# Patient Record
Sex: Female | Born: 1950 | Race: White | Hispanic: No | Smoking: Never smoker
Health system: Southern US, Community
[De-identification: ages and names within clinical notes are randomized; demographics above are authoritative.]

## PROBLEM LIST (undated history)

## (undated) DIAGNOSIS — M1990 Unspecified osteoarthritis, unspecified site: Secondary | ICD-10-CM

## (undated) DIAGNOSIS — T7840XA Allergy, unspecified, initial encounter: Secondary | ICD-10-CM

## (undated) DIAGNOSIS — B829 Intestinal parasitism, unspecified: Secondary | ICD-10-CM

## (undated) DIAGNOSIS — E079 Disorder of thyroid, unspecified: Secondary | ICD-10-CM

## (undated) DIAGNOSIS — A6920 Lyme disease, unspecified: Secondary | ICD-10-CM

## (undated) DIAGNOSIS — K5790 Diverticulosis of intestine, part unspecified, without perforation or abscess without bleeding: Secondary | ICD-10-CM

## (undated) DIAGNOSIS — B2790 Infectious mononucleosis, unspecified without complication: Secondary | ICD-10-CM

## (undated) DIAGNOSIS — R569 Unspecified convulsions: Secondary | ICD-10-CM

## (undated) DIAGNOSIS — G35 Multiple sclerosis: Secondary | ICD-10-CM

## (undated) DIAGNOSIS — M810 Age-related osteoporosis without current pathological fracture: Secondary | ICD-10-CM

## (undated) HISTORY — DX: Unspecified convulsions: R56.9

## (undated) HISTORY — DX: Infectious mononucleosis, unspecified without complication: B27.90

## (undated) HISTORY — DX: Unspecified osteoarthritis, unspecified site: M19.90

## (undated) HISTORY — DX: Allergy, unspecified, initial encounter: T78.40XA

## (undated) HISTORY — DX: Age-related osteoporosis without current pathological fracture: M81.0

## (undated) HISTORY — DX: Multiple sclerosis: G35

## (undated) HISTORY — DX: Diverticulosis of intestine, part unspecified, without perforation or abscess without bleeding: K57.90

## (undated) HISTORY — DX: Intestinal parasitism, unspecified: B82.9

## (undated) HISTORY — DX: Disorder of thyroid, unspecified: E07.9

## (undated) HISTORY — DX: Lyme disease, unspecified: A69.20

## (undated) HISTORY — PX: COLONOSCOPY: SHX174

---

## 2012-12-03 ENCOUNTER — Encounter: Payer: Self-pay | Admitting: Internal Medicine

## 2012-12-20 ENCOUNTER — Encounter: Payer: Self-pay | Admitting: Internal Medicine

## 2012-12-20 ENCOUNTER — Ambulatory Visit (AMBULATORY_SURGERY_CENTER): Payer: BC Managed Care – PPO | Admitting: *Deleted

## 2012-12-20 VITALS — Ht 67.0 in | Wt 144.6 lb

## 2012-12-20 DIAGNOSIS — K921 Melena: Secondary | ICD-10-CM

## 2012-12-20 MED ORDER — MOVIPREP 100 G PO SOLR: 1.0000 | Freq: Once | ORAL | Status: DC

## 2012-12-20 NOTE — Progress Notes (Signed)
Patient states prior GI history with Jade Howard. Release of information form completed to request medical records. Patient has current complaints of heme + stools, so will proceed with colonoscopy as scheduled per recommendation of Dr. Carlyon Prows. Records release given to [facility] for request.

## 2012-12-21 ENCOUNTER — Telehealth: Payer: Self-pay | Admitting: Internal Medicine

## 2012-12-21 NOTE — Telephone Encounter (Signed)
Forward  14 pages from [facility] to Dr. Erick Blinks for review on 12-21-12 ym

## 2013-01-10 ENCOUNTER — Encounter: Payer: Self-pay | Admitting: Internal Medicine

## 2013-01-14 ENCOUNTER — Encounter: Payer: BC Managed Care – PPO | Admitting: Internal Medicine

## 2013-02-24 ENCOUNTER — Telehealth: Payer: Self-pay | Admitting: Internal Medicine

## 2013-02-24 NOTE — Telephone Encounter (Signed)
Pt reports she had a COLON 10 yrs ago in [place] and she doesn't remember where. She has had one episode of rectal bleeding and mentioned it to Dr Alessandra Bevels, her PCP and she ordered the COLON. She found out today her procedure will not be covered as well and she can't afford to have it done. Pt states she's have no problem since the one episode. Suggested to pt she find where her procedure was done and get the report and we will discuss rescheduling her procedure at the 10 year mark. i will check with Dr Rhea Belton. Pt stated understanding.

## 2013-02-24 NOTE — Telephone Encounter (Signed)
No charge.  To Graciella Freer

## 2013-02-25 ENCOUNTER — Encounter: Payer: BC Managed Care – PPO | Admitting: Internal Medicine

## 2013-04-06 ENCOUNTER — Telehealth: Payer: Self-pay | Admitting: Internal Medicine

## 2013-04-06 NOTE — Telephone Encounter (Signed)
Spoke with patient and told her I do not have any notes as to who called her. She will check with her PCP about this.

## 2013-04-18 ENCOUNTER — Other Ambulatory Visit: Payer: Self-pay | Admitting: Internal Medicine

## 2013-04-18 DIAGNOSIS — R109 Unspecified abdominal pain: Secondary | ICD-10-CM

## 2013-04-20 ENCOUNTER — Ambulatory Visit
Admission: RE | Admit: 2013-04-20 | Discharge: 2013-04-20 | Disposition: A | Discharge status: Home / Self Care | Payer: BC Managed Care – PPO | Source: Ambulatory Visit | Attending: Internal Medicine | Admitting: Internal Medicine

## 2013-04-20 DIAGNOSIS — R109 Unspecified abdominal pain: Secondary | ICD-10-CM

## 2013-06-21 ENCOUNTER — Ambulatory Visit (AMBULATORY_SURGERY_CENTER): Payer: BC Managed Care – PPO

## 2013-06-21 ENCOUNTER — Encounter: Payer: Self-pay | Admitting: Internal Medicine

## 2013-06-21 VITALS — Ht 67.0 in | Wt 150.4 lb

## 2013-06-21 DIAGNOSIS — Z1211 Encounter for screening for malignant neoplasm of colon: Secondary | ICD-10-CM

## 2013-06-21 MED ORDER — MOVIPREP 100 G PO SOLR: ORAL | Status: DC

## 2013-06-21 NOTE — Progress Notes (Signed)
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 Will discuss with the manager if the colonoscopy will need to be rescheduled. Pt aware.

## 2013-06-22 ENCOUNTER — Telehealth: Payer: Self-pay

## 2013-06-22 NOTE — Telephone Encounter (Signed)
Intestinal parasite such as Giardia or roundworm should not prevent or complicate screening colonoscopy I'm okay proceeding with screening colonoscopy unless she prefers to reschedule

## 2013-06-22 NOTE — Telephone Encounter (Signed)
Spoke with pt and informed her it was OK per Dr Rhea Belton, to proceed with the colonoscopy on 07/08/13 as scheduled.She did state she found her copy of the last colonoscopy done 10 years ago and she will bring it with her to her appt. She will call our office if she has any questions or problems.Jade Howard

## 2013-06-22 NOTE — Telephone Encounter (Signed)
Dr. 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 She went to [place] 5 years ago, but her doctor does not know how or when she contacted the parasites.Do you need to see her in the office or should we cancel her appointment on 07/08/13, until she finishes her meds. Please advise. Thanks,Aeriel Boulay

## 2013-07-05 ENCOUNTER — Telehealth: Payer: Self-pay | Admitting: Internal Medicine

## 2013-07-05 NOTE — Telephone Encounter (Signed)
noted 

## 2013-07-05 NOTE — Telephone Encounter (Signed)
Can attempt nonsedated colonoscopy, I agree that she should have a driver in case she does need mild sedation She has no driver no sedation will be/can be given

## 2013-07-05 NOTE — Telephone Encounter (Signed)
Dr. Rhea Belton:  Pt is scheduled for colonoscopy this Friday 07/08/13.  After watching Emmi video,she does not want sedation for procedure.  She is aware that she may feel some cramping and discomfort during procedure.  I told her that I would make you aware of her request and that she would need to discuss with you and nurse anesthetist day of procedure.  I also told her that she would still need someone with her day of procedure just in case she decides to have sedation.  Are you okay with this?  Thanks, Olegario Messier in [place].

## 2013-07-08 ENCOUNTER — Ambulatory Visit (AMBULATORY_SURGERY_CENTER): Payer: BC Managed Care – PPO | Admitting: Internal Medicine

## 2013-07-08 ENCOUNTER — Encounter: Payer: Self-pay | Admitting: Internal Medicine

## 2013-07-08 VITALS — BP 152/91 | HR 51 | Resp 20 | Ht 67.0 in | Wt 135.0 lb

## 2013-07-08 DIAGNOSIS — Z1211 Encounter for screening for malignant neoplasm of colon: Secondary | ICD-10-CM

## 2013-07-08 MED ORDER — SODIUM CHLORIDE 0.9 % IV SOLN: 500.0000 mL | INTRAVENOUS | Status: DC

## 2013-07-08 NOTE — Patient Instructions (Addendum)
YOU HAD AN ENDOSCOPIC PROCEDURE TODAY AT THE [facility]: Refer to the procedure report that was given to you for any specific questions about what was found during the examination.  If the procedure report does not answer your questions, please call your gastroenterologist to clarify.  If you requested that your care partner not be given the details of your procedure findings, then the procedure report has been included in a sealed envelope for you to review at your convenience later.  YOU SHOULD EXPECT: Some feelings of bloating in the abdomen. Passage of more gas than usual.  Walking can help get rid of the air that was put into your GI tract during the procedure and reduce the bloating. If you had a lower endoscopy (such as a colonoscopy or flexible sigmoidoscopy) you may notice spotting of blood in your stool or on the toilet paper. If you underwent a bowel prep for your procedure, then you may not have a normal bowel movement for a few days.  DIET: Your first meal following the procedure should be a light meal and then it is ok to progress to your normal diet.  A half-sandwich or bowl of soup is an example of a good first meal.  Heavy or fried foods are harder to digest and may make you feel nauseous or bloated.  Likewise meals heavy in dairy and vegetables can cause extra gas to form and this can also increase the bloating.  Drink plenty of fluids but you should avoid alcoholic beverages for 24 hours.  ACTIVITY: Your care partner should take you home directly after the procedure.  You should plan to take it easy, moving slowly for the rest of the day.  You can resume normal activity the day after the procedure however you should NOT DRIVE or use heavy machinery for 24 hours (because of the sedation medicines used during the test).    SYMPTOMS TO REPORT IMMEDIATELY: A gastroenterologist can be reached at any hour.  During normal business hours, 8:30 AM to 5:00 PM Monday through Friday,  call [phone].  After hours and on weekends, please call the GI answering service at [phone] who will take a message and have the physician on call contact you.   Following lower endoscopy (colonoscopy or flexible sigmoidoscopy):  Excessive amounts of blood in the stool  Significant tenderness or worsening of abdominal pains  Swelling of the abdomen that is new, acute  Fever of 100F or higher  FOLLOW UP: If any biopsies were taken you will be contacted by phone or by letter within the next 1-3 weeks.  Call your gastroenterologist if you have not heard about the biopsies in 3 weeks.  Our staff will call the home number listed on your records the next business day following your procedure to check on you and address any questions or concerns that you may have at that time regarding the information given to you following your procedure. This is a courtesy call and so if there is no answer at the home number and we have not heard from you through the emergency physician on call, we will assume that you have returned to your regular daily activities without incident.  SIGNATURES/CONFIDENTIALITY: You and/or your care partner have signed paperwork which will be entered into your electronic medical record.  These signatures attest to the fact that that the information above on your After Visit Summary has been reviewed and is understood.  Full responsibility of the confidentiality of this   discharge information lies with you and/or your care-partner.  Resume medications. Information given on diverticulosis and high fiber diet with discharge instructions. 

## 2013-07-08 NOTE — Progress Notes (Signed)
Pt. Received into recovery without sedation,stable and expelling large amounts of air, abdomen soft denies pain. Discharge instructions completed with pt. And she verbalize understanding.

## 2013-07-08 NOTE — Progress Notes (Signed)
Patient did not experience any of the following events: a burn prior to discharge; a fall within the facility; wrong site/side/patient/procedure/implant event; or a hospital transfer or hospital admission upon discharge from the facility. (G8907) Patient did not have preoperative order for IV antibiotic SSI prophylaxis. (G8918)  

## 2013-07-08 NOTE — Op Note (Signed)
[facility] [address] [place], [address]   COLONOSCOPY PROCEDURE REPORT  PATIENT: Jade Howard, Jade Howard  MR#: [id number] BIRTHDATE: 09-Aug-1951 , 61  yrs. old GENDER: Female ENDOSCOPIST: Beverley Fiedler, MD REFERRED [id number] Ananias Pilgrim, M.D. PROCEDURE DATE:  07/08/2013 PROCEDURE:   Colonoscopy, screening First Screening Colonoscopy - Avg.  risk and is 50 yrs.  old or older - No.  Prior Negative Screening - Now for repeat screening. 10 or more years since last screening  History of Adenoma - Now for follow-up colonoscopy & has been > or = to 3 yrs.  N/A  Polyps Removed Today? No.  Recommend repeat exam, <10 yrs? No. ASA CLASS:   Class II INDICATIONS:average risk screening and Last colonoscopy performed 10 years ago. MEDICATIONS: None  DESCRIPTION OF PROCEDURE:   After the risks benefits and alternatives of the procedure were thoroughly explained, informed consent was obtained.  A digital rectal exam revealed no rectal mass.   The LB [id number]  endoscope was introduced through the anus and advanced to the terminal ileum which was intubated for a short distance. No adverse events experienced.   The quality of the prep was good, using MoviPrep  The instrument was then slowly withdrawn as the colon was fully examined.  COLON FINDINGS: The mucosa appeared normal in the terminal ileum. There was mild scattered diverticulosis noted at the hepatic flexure, in the descending colon, and sigmoid colon.   The colon mucosa was otherwise normal.  Retroflexed views revealed external hemorrhoids. The time to cecum=7 minutes 26 seconds.  Withdrawal time=14 minutes 10 seconds.  The scope was withdrawn and the procedure completed. COMPLICATIONS: There were no complications.  ENDOSCOPIC IMPRESSION: 1.   Normal mucosa in the terminal ileum 2.   There was mild diverticulosis noted at the hepatic flexure, in the descending colon, and sigmoid colon 3.   The colon mucosa was  otherwise normal  RECOMMENDATIONS: 1.  High fiber diet 2.  You should continue to follow colorectal cancer screening guidelines for "routine risk" patients with a repeat colonoscopy in 10 years.  There is no need for FOBT (stool) testing for at least 5 years.   eSigned:  Beverley Fiedler, MD 07/08/2013 2:14 PM   cc: The Patient and Mia Creek, MD

## 2013-07-08 NOTE — Progress Notes (Signed)
Pt requested NO IV sedation- Dr Rhea Belton discussed with patient, agreed. Post procedure- Pt comfortable tolerated well. Alert  To RR

## 2013-07-11 ENCOUNTER — Telehealth: Payer: Self-pay | Admitting: *Deleted

## 2013-07-11 NOTE — Telephone Encounter (Signed)
Left message that we called for f/u 

## 2014-05-13 IMAGING — US US PELVIS COMPLETE
1 series · 14 of 25 positions shown · non-contrast
Comparison: None

CLINICAL DATA: Pelvic pain

TRANSABDOMINAL AND TRANSVAGINAL ULTRASOUND OF PELVIS
TECHNIQUE: Both transabdominal and transvaginal ultrasound
examinations of the pelvis were performed including evaluation of
the uterus, ovaries, adnexal regions, and pelvic cul-de-sac.

[Series 1: us pelvis complete · 0.32mm/px · 14 of 35 slices shown]
[im 1/35]
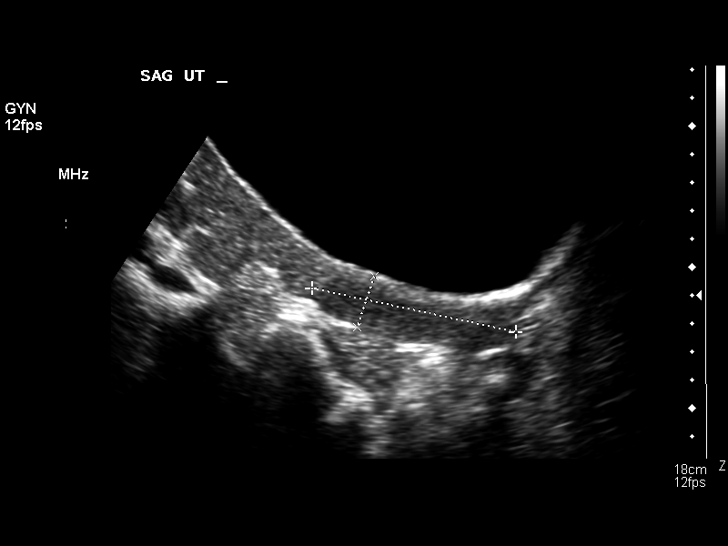
[im 3/35]
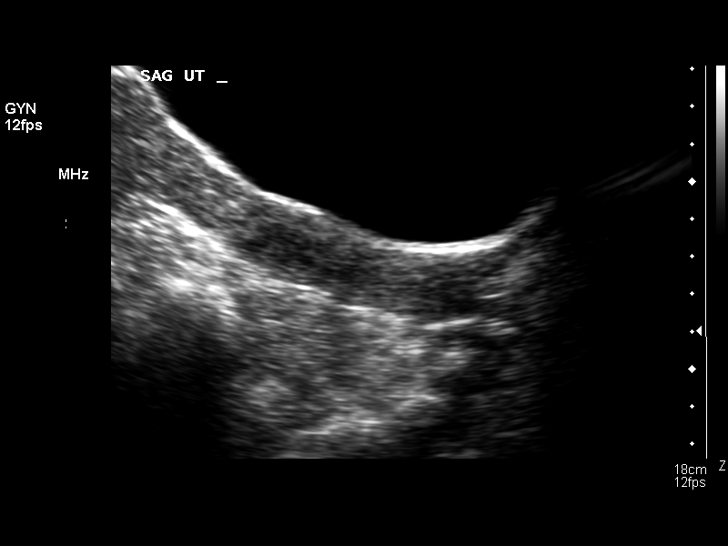
[im 6/35]
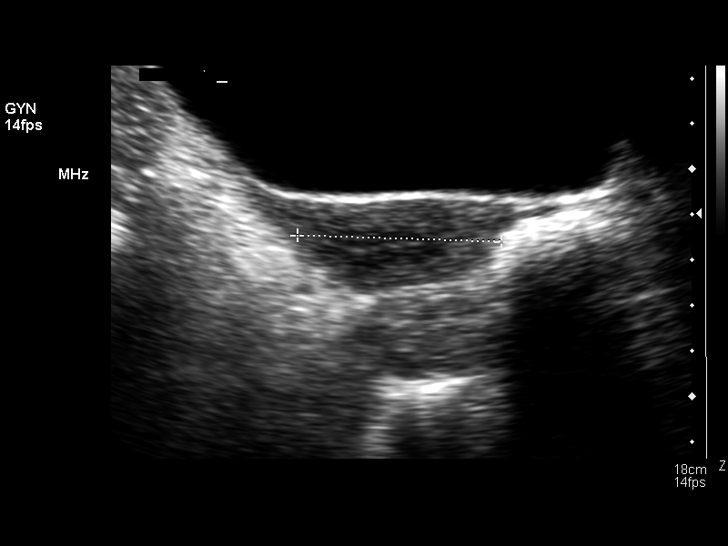
[im 9/35]
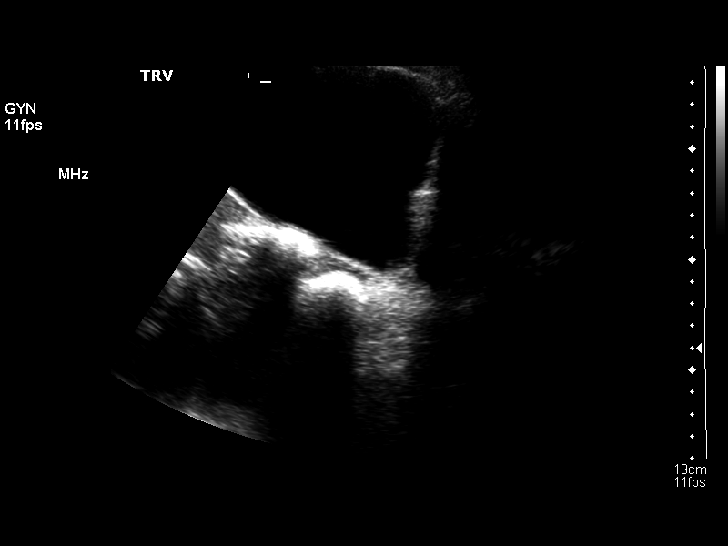
[im 12/35]
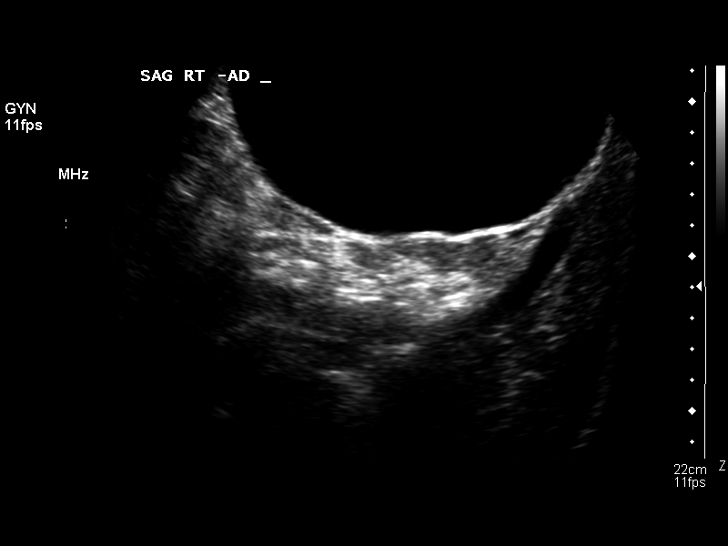
[im 13/35]
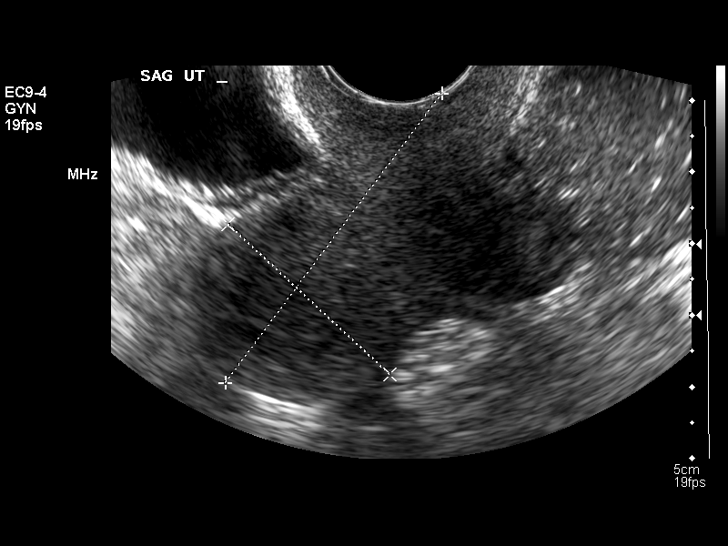
[im 16/35]
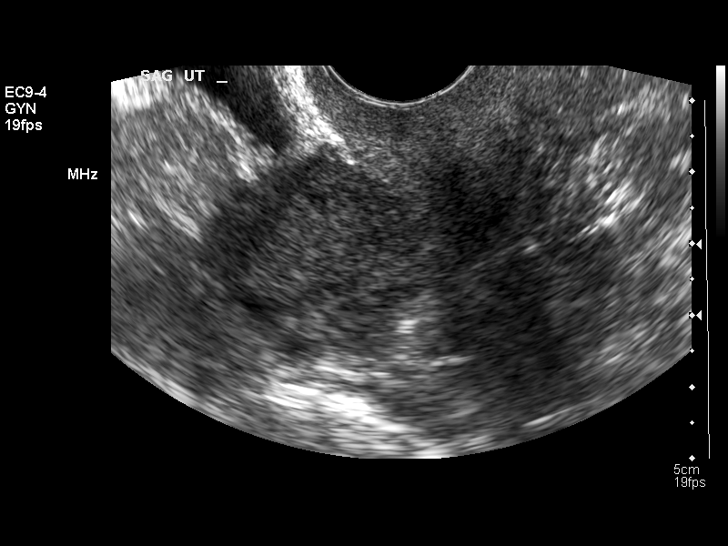
[im 19/35]
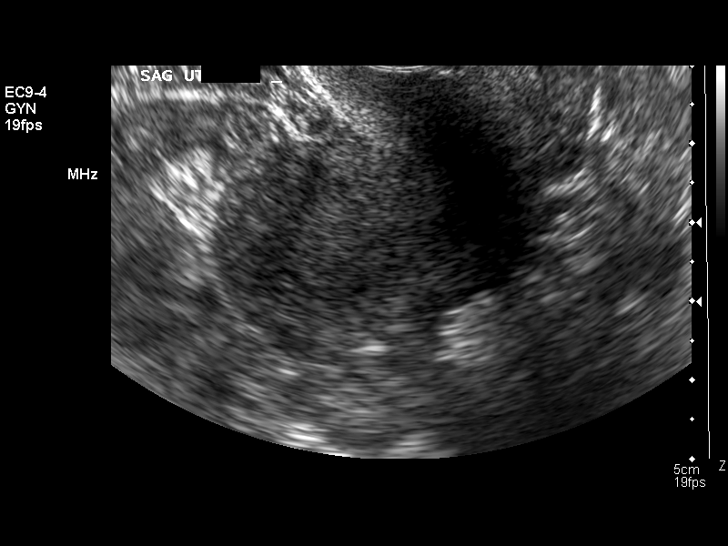
[im 22/35]
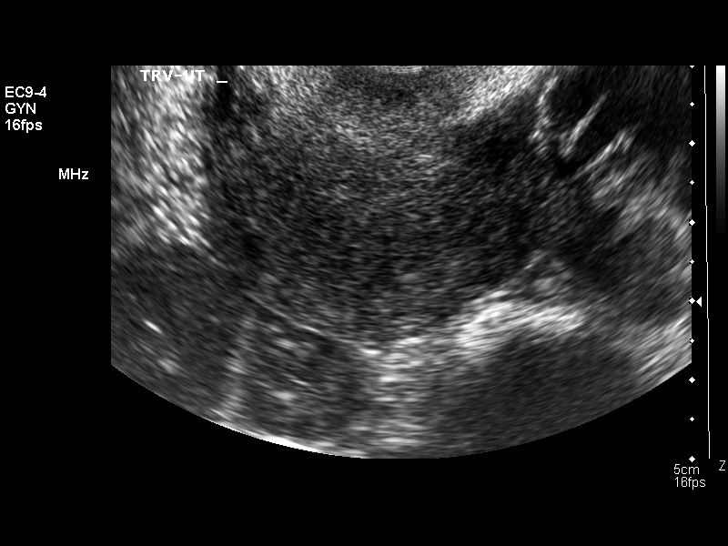
[im 23/35]
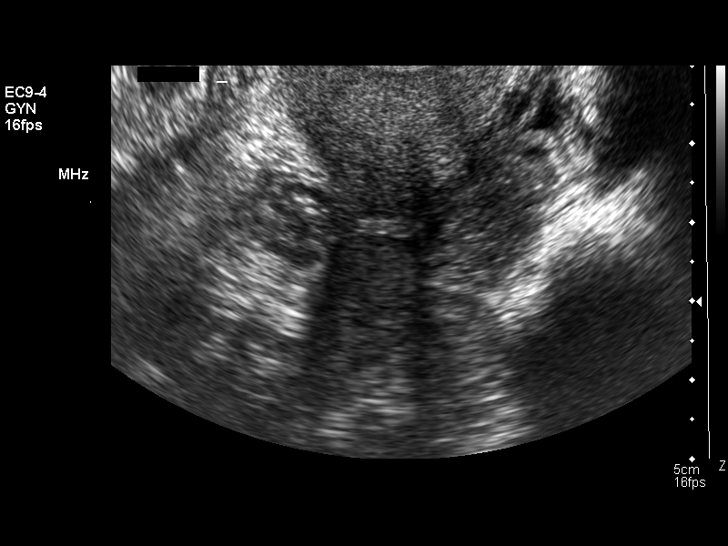
[im 26/35]
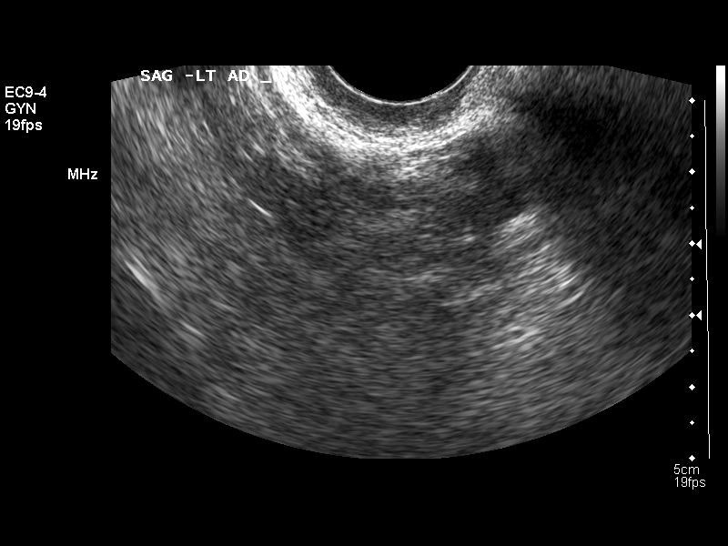
[im 29/35]
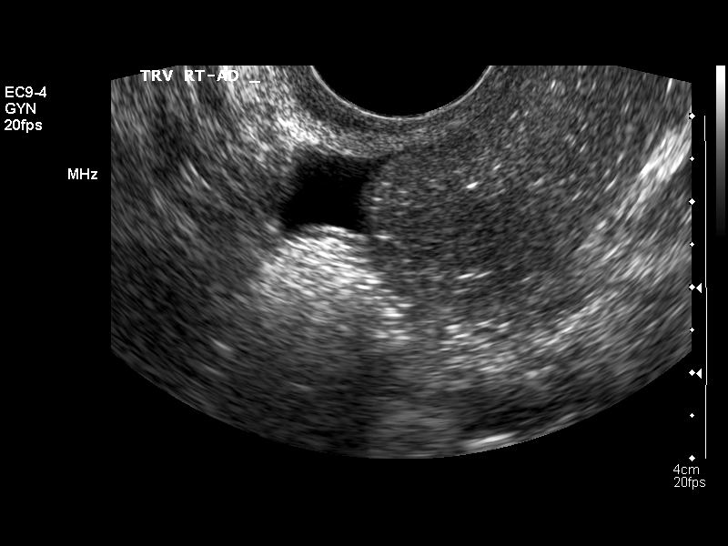
[im 32/35]
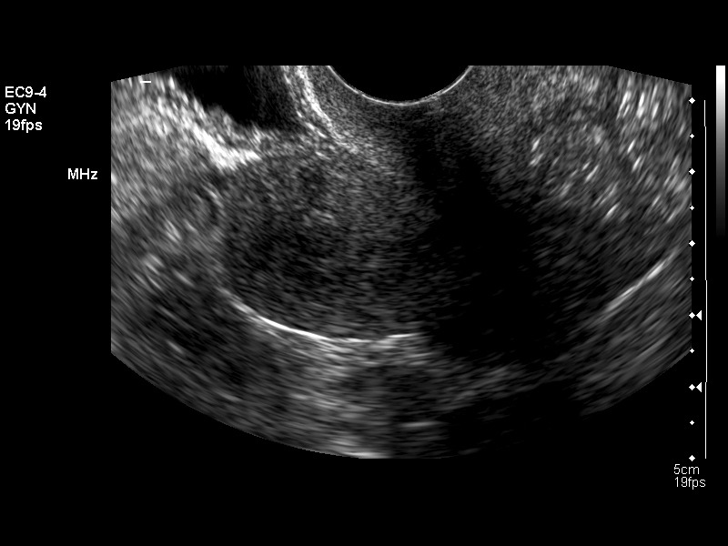
[im 35/35]
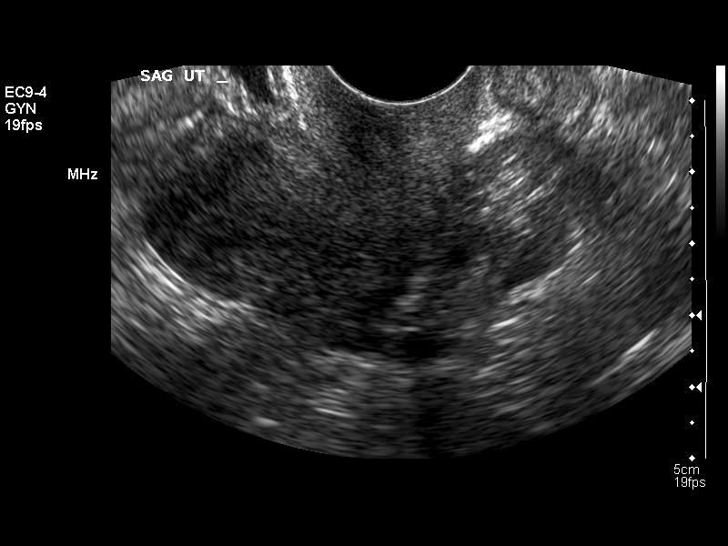

[14 of 25 positions shown; findings below may reference images not displayed]

FINDINGS: Uterus: The uterus measures 5.0 cm sagittally with a depth of
cm and width of 3.4 cm.  No myometrial abnormality is seen.

Endometrium:The endometrium is normal measuring only 1 mm in
thickness.

Right Ovary :The right ovary is not visualized.

Left Ovary :The left ovary is not visualized.

Other Findings:  There is a tiny amount of free fluid is noted in
the right adnexa.
IMPRESSION: 1.  The uterus is normal in size.  No myometrial or endometrial
abnormality is seen.
2.  Neither ovary is visualized.  A tiny amount of fluid is noted
in the right adnexa.

## 2018-12-01 HISTORY — PX: KNEE ARTHROPLASTY: SHX992

## 2023-04-22 ENCOUNTER — Encounter: Payer: Self-pay | Admitting: Internal Medicine

## 2023-05-28 ENCOUNTER — Ambulatory Visit (AMBULATORY_SURGERY_CENTER): Payer: Medicare Other

## 2023-05-28 VITALS — Ht 67.0 in | Wt 145.0 lb

## 2023-05-28 DIAGNOSIS — Z1211 Encounter for screening for malignant neoplasm of colon: Secondary | ICD-10-CM

## 2023-05-28 MED ORDER — NA SULFATE-K SULFATE-MG SULF 17.5-3.13-1.6 GM/177ML PO SOLN
1.0000 | Freq: Once | ORAL | 0 refills | Status: AC
Start: 2023-05-28 — End: 2023-05-28

## 2023-05-28 NOTE — Progress Notes (Signed)
No egg or soy allergy known to patient  No issues known to pt with past sedation with any surgeries or procedures Patient denies ever being told they had issues or difficulty with intubation  No FH of Malignant Hyperthermia Pt is not on diet pills Pt is not on  home 02  Pt is not on blood thinners  Pt denies issues with constipation  No A fib or A flutter Have any cardiac testing pending--no Patient's chart reviewed by John Nulty CNRA prior to previsit and patient appropriate for the [facility].  Previsit completed and red dot placed by patient's name on their procedure day (on provider's schedule).   Pt instructed to use Singlecare.com or GoodRx for a price reduction on prep   

## 2023-06-01 ENCOUNTER — Encounter: Payer: Self-pay | Admitting: Internal Medicine

## 2023-07-01 ENCOUNTER — Encounter: Payer: Medicare Other | Admitting: Internal Medicine

## 2023-07-28 ENCOUNTER — Encounter: Payer: Medicare Other | Admitting: Internal Medicine

## 2023-07-28 ENCOUNTER — Telehealth: Payer: Self-pay | Admitting: Internal Medicine

## 2023-07-28 NOTE — Telephone Encounter (Signed)
Inbound call from patient stating she is scheduled for her colon on 8/29 at 2:00 and is requesting a call to discuss prep instructions. Please advise.

## 2023-07-28 NOTE — Telephone Encounter (Signed)
All questions answered

## 2023-07-30 ENCOUNTER — Encounter: Payer: Self-pay | Admitting: Internal Medicine

## 2023-07-30 ENCOUNTER — Ambulatory Visit (AMBULATORY_SURGERY_CENTER): Payer: Medicare Other | Admitting: Internal Medicine

## 2023-07-30 VITALS — BP 110/77 | HR 57 | Temp 98.0°F | Resp 11 | Ht 67.0 in | Wt 145.0 lb

## 2023-07-30 DIAGNOSIS — Z1211 Encounter for screening for malignant neoplasm of colon: Secondary | ICD-10-CM | POA: Diagnosis not present

## 2023-07-30 DIAGNOSIS — D124 Benign neoplasm of descending colon: Secondary | ICD-10-CM

## 2023-07-30 DIAGNOSIS — D125 Benign neoplasm of sigmoid colon: Secondary | ICD-10-CM

## 2023-07-30 DIAGNOSIS — K635 Polyp of colon: Secondary | ICD-10-CM

## 2023-07-30 MED ORDER — SODIUM CHLORIDE 0.9 % IV SOLN
500.0000 mL | INTRAVENOUS | Status: DC
Start: 2023-07-30 — End: 2023-07-30

## 2023-07-30 NOTE — Progress Notes (Signed)
Called to room to assist during endoscopic procedure.  Patient ID and intended procedure confirmed with present staff. Received instructions for my participation in the procedure from the performing physician.  

## 2023-07-30 NOTE — Progress Notes (Signed)
GASTROENTEROLOGY PROCEDURE H&P NOTE   Primary Care Physician: Punger, Demetrio Lapping, MD    Reason for Procedure:  Colon cancer screening  Plan:    Colonoscopy  Patient is appropriate for endoscopic procedure(s) in the ambulatory ([facility]) setting.  The nature of the procedure, as well as the risks, benefits, and alternatives were carefully and thoroughly reviewed with the patient. Ample time for discussion and questions allowed. The patient understood, was satisfied, and agreed to proceed.     HPI: Jade Howard is a 72 y.o. female who presents for screening colonoscopy.  Medical history as below.  Tolerated the prep.  No recent chest pain or shortness of breath.  No abdominal pain today.  Past Medical History:  Diagnosis Date   Allergy    Arthritis    Malachi Carl infection    Gastrointestinal parasites    Pt being treated for this now/05/2013   Lyme disease    Osteoporosis    Seizures (HCC)    Thyroid disease     Past Surgical History:  Procedure Laterality Date   COLONOSCOPY     KNEE ARTHROPLASTY Right 2020    Prior to Admission medications   Medication Sig Start Date End Date Taking? Authorizing Provider  ascorbic acid (VITAMIN C) 500 MG tablet Take by mouth.   Yes [provider]  CALCIUM LACTATE PO Take by mouth.   Yes [provider]  CALCIUM-VITAMIN D-VITAMIN K PO Take by mouth.   Yes [provider]  Cyanocobalamin (B-12) 1000 MCG CAPS Take 1 tablet by mouth 2 (two) times daily.   Yes [provider]  Fish Oil-Krill Oil (KRILL & FISH OIL BLEND PO)  01/30/23  Yes [provider]  MAGNESIUM GLYCINATE PLUS PO Take 2 tablets by mouth at bedtime.   Yes [provider]  Magnesium Oxide 140 MG CAPS Take by mouth.   Yes [provider]  Multiple Vitamin (MULTIVITAMIN) capsule Take 1 capsule by mouth daily. 05/28/22  Yes [provider]  Nutritional Supplements (ADRENAL COMPLEX PO)  12/01/20  Yes  [provider]  Nutritional Supplements (DHEA PO) Take 5 mg by mouth daily.    [provider]    Current Outpatient Medications  Medication Sig Dispense Refill   ascorbic acid (VITAMIN C) 500 MG tablet Take by mouth.     CALCIUM LACTATE PO Take by mouth.     CALCIUM-VITAMIN D-VITAMIN K PO Take by mouth.     Cyanocobalamin (B-12) 1000 MCG CAPS Take 1 tablet by mouth 2 (two) times daily.     Fish Oil-Krill Oil (KRILL & FISH OIL BLEND PO)      MAGNESIUM GLYCINATE PLUS PO Take 2 tablets by mouth at bedtime.     Magnesium Oxide 140 MG CAPS Take by mouth.     Multiple Vitamin (MULTIVITAMIN) capsule Take 1 capsule by mouth daily.     Nutritional Supplements (ADRENAL COMPLEX PO)      Nutritional Supplements (DHEA PO) Take 5 mg by mouth daily.     Current Facility-Administered Medications  Medication Dose Route Frequency Provider Last Rate Last Admin   0.9 %  sodium chloride infusion  500 mL Intravenous Continuous Remmy Riffe, Carie Caddy, MD        Allergies as of 07/30/2023 - Review Complete 07/30/2023  Allergen Reaction Noted   Lamisil [terbinafine] Other (See Comments) 12/20/2012    Family History  Problem Relation Age of Onset   Clotting disorder Father    Heart disease Father  Breast cancer Maternal Aunt    Diabetes Maternal Uncle    Breast cancer Maternal Grandmother    Heart disease Maternal Grandfather    Colon cancer Neg Hx    Esophageal cancer Neg Hx    Rectal cancer Neg Hx    Stomach cancer Neg Hx    Colon polyps Neg Hx     Social History   Socioeconomic History   Marital status: Unknown    Spouse name: Not on file   Number of children: Not on file   Years of education: Not on file   Highest education level: Not on file  Occupational History   Not on file  Tobacco Use   Smoking status: Never   Smokeless tobacco: Never  Substance and Sexual Activity   Alcohol use: No   Drug use: No   Sexual activity: Not on file  Other Topics Concern   Not on  file  Social History Narrative   Not on file   Social Determinants of Health   Financial Resource Strain: Low Risk  (08/02/2022)   Received from [facility], [facility]   Overall Financial Resource Strain (CARDIA)    Difficulty of Paying Living Expenses: Not very hard  Food Insecurity: Low Risk  (06/01/2023)   [facility]   Food vital sign    Within the past 12 months, you worried that your food would run out before you got money to buy more: Never true    Within the past 12 months, the food you bought just didn't last and you didn't have money to get more. : Never true  Transportation Needs: No Transportation Needs (06/01/2023)   [facility]   Transportation    In the past 12 months, has lack of reliable transportation kept you from medical appointments, meetings, work or from getting things needed for daily living? : No  Physical Activity: Sufficiently Active (08/02/2022)   Received from [facility], [facility]   Exercise Vital Sign    Days of Exercise per Week: 6 days    Minutes of Exercise per Session: 40 min  Stress: No Stress Concern Present (08/02/2022)   Received from [facility], [facility] - Occupational Stress Questionnaire    Feeling of Stress : Only a little  Social Connections: Socially Integrated (08/02/2022)   Received from [facility], [facility]   Social Network    How would you rate your social network (family, work, friends)?: Good participation with social networks  Recent Concern: Social Connections - Moderately Isolated (05/21/2022)   Received from [facility], [facility] visits prior to 01/31/2023., [facility] visits prior to 01/31/2023., [facility]   Social Connection and Isolation Panel [NHANES]    Frequency of Communication with Friends and Family: More than three times a week    Frequency of  Social Gatherings with Friends and Family: More than three times a week    Attends Religious Services: Never    Database administrator or Organizations: Yes    Attends Engineer, structural: More than 4 times per year    Marital Status: Divorced  Intimate Partner Violence: Not At Risk (08/02/2022)   Received from [facility], [facility]   HITS    Over the last 12 months how often did your partner physically hurt you?: 1    Over the last 12 months how often did  your partner insult you or talk down to you?: 1    Over the last 12 months how often did your partner threaten you with physical harm?: 1    Over the last 12 months how often did your partner scream or curse at you?: 1    Physical Exam: Vital signs in last 24 hours: @BP  125/88   Pulse 68   Temp 98 F (36.7 C)   Ht 5\' 7"  (1.702 m)   Wt 145 lb (65.8 kg)   SpO2 97%   BMI 22.71 kg/m  GEN: NAD EYE: Sclerae anicteric ENT: MMM CV: Non-tachycardic Pulm: CTA b/l GI: Soft, NT/ND NEURO:  Alert & Oriented x 3   Erick Blinks, MD [facility]  07/30/2023 1:35 PM

## 2023-07-30 NOTE — Progress Notes (Signed)
Report to PACU, RN, vss, BBS= Clear.  

## 2023-07-30 NOTE — Patient Instructions (Signed)
Handouts Provided:  Polyps and Diverticulosis  YOU HAD AN ENDOSCOPIC PROCEDURE TODAY AT THE  ENDOSCOPY CENTER:   Refer to the procedure report that was given to you for any specific questions about what was found during the examination.  If the procedure report does not answer your questions, please call your gastroenterologist to clarify.  If you requested that your care partner not be given the details of your procedure findings, then the procedure report has been included in a sealed envelope for you to review at your convenience later.  YOU SHOULD EXPECT: Some feelings of bloating in the abdomen. Passage of more gas than usual.  Walking can help get rid of the air that was put into your GI tract during the procedure and reduce the bloating. If you had a lower endoscopy (such as a colonoscopy or flexible sigmoidoscopy) you may notice spotting of blood in your stool or on the toilet paper. If you underwent a bowel prep for your procedure, you may not have a normal bowel movement for a few days.  Please Note:  You might notice some irritation and congestion in your nose or some drainage.  This is from the oxygen used during your procedure.  There is no need for concern and it should clear up in a day or so.  SYMPTOMS TO REPORT IMMEDIATELY:  Following lower endoscopy (colonoscopy or flexible sigmoidoscopy):  Excessive amounts of blood in the stool  Significant tenderness or worsening of abdominal pains  Swelling of the abdomen that is new, acute  Fever of 100F or higher  For urgent or emergent issues, a gastroenterologist can be reached at any hour by calling [phone]. Do not use MyChart messaging for urgent concerns.    DIET:  We do recommend a small meal at first, but then you may proceed to your regular diet.  Drink plenty of fluids but you should avoid alcoholic beverages for 24 hours.  ACTIVITY:  You should plan to take it easy for the rest of today and you should NOT DRIVE  or use heavy machinery until tomorrow (because of the sedation medicines used during the test).    FOLLOW UP: Our staff will call the number listed on your records the next business day following your procedure.  We will call around 7:15- 8:00 am to check on you and address any questions or concerns that you may have regarding the information given to you following your procedure. If we do not reach you, we will leave a message.     If any biopsies were taken you will be contacted by phone or by letter within the next 1-3 weeks.  Please call us at [phone] if you have not heard about the biopsies in 3 weeks.    SIGNATURES/CONFIDENTIALITY: You and/or your care partner have signed paperwork which will be entered into your electronic medical record.  These signatures attest to the fact that that the information above on your After Visit Summary has been reviewed and is understood.  Full responsibility of the confidentiality of this discharge information lies with you and/or your care-partner.  

## 2023-07-30 NOTE — Op Note (Signed)
[facility] Patient Name: Jade Howard Procedure Date: 07/30/2023 1:27 PM MRN: [id number] Endoscopist: Beverley Fiedler , MD, [id number] Age: 72 Referring MD:  Date of Birth: Apr 17, 1951 Gender: Female Account #: [id number] Procedure:                Colonoscopy Indications:              Screening for colorectal malignant neoplasm, Last                            colonoscopy 10 years ago Medicines:                Monitored Anesthesia Care Procedure:                Pre-Anesthesia Assessment:                           - Prior to the procedure, a History and Physical                            was performed, and patient medications and                            allergies were reviewed. The patient's tolerance of                            previous anesthesia was also reviewed. The risks                            and benefits of the procedure and the sedation                            options and risks were discussed with the patient.                            All questions were answered, and informed consent                            was obtained. Prior Anticoagulants: The patient has                            taken no anticoagulant or antiplatelet agents. ASA                            Grade Assessment: II - A patient with mild systemic                            disease. After reviewing the risks and benefits,                            the patient was deemed in satisfactory condition to                            undergo the procedure.  After obtaining informed consent, the colonoscope                            was passed under direct vision. Throughout the                            procedure, the patient's blood pressure, pulse, and                            oxygen saturations were monitored continuously. The                            PCF-HQ190L Colonoscope [id number] was introduced                            through the anus and advanced to the  cecum,                            identified by appendiceal orifice and ileocecal                            valve. The colonoscopy was performed without                            difficulty. The patient tolerated the procedure                            well. The quality of the bowel preparation was                            good. The ileocecal valve, appendiceal orifice, and                            rectum were photographed. Scope In: 1:42:13 PM Scope Out: 1:55:34 PM Scope Withdrawal Time: 0 hours 9 minutes 40 seconds  Total Procedure Duration: 0 hours 13 minutes 21 seconds  Findings:                 The digital rectal exam was normal.                           A 4 mm polyp was found in the descending colon. The                            polyp was sessile. The polyp was removed with a                            cold snare. Resection and retrieval were complete.                           Two sessile polyps were found in the sigmoid colon.                            The polyps were 2 to 5 mm  in size. These polyps                            were removed with a cold snare. Resection and                            retrieval were complete.                           Multiple medium-mouthed and small-mouthed                            diverticula were found in the sigmoid colon,                            descending colon and transverse colon.                           The retroflexed view of the distal rectum and anal                            verge was normal and showed no anal or rectal                            abnormalities. Complications:            No immediate complications. Estimated Blood Loss:     Estimated blood loss was minimal. Impression:               - One 4 mm polyp in the descending colon, removed                            with a cold snare. Resected and retrieved.                           - Two 2 to 5 mm polyps in the sigmoid colon,                             removed with a cold snare. Resected and retrieved.                           - Moderate diverticulosis in the sigmoid colon, in                            the descending colon and in the transverse colon.                           - The distal rectum and anal verge are normal on                            retroflexion view. Recommendation:           - Patient has a contact number available for  emergencies. The signs and symptoms of potential                            delayed complications were discussed with the                            patient. Return to normal activities tomorrow.                            Written discharge instructions were provided to the                            patient.                           - Resume previous diet.                           - Continue present medications.                           - Await pathology results.                           - Repeat colonoscopy is recommended. The                            colonoscopy date will be determined after pathology                            results from today's exam become available for                            review. Beverley Fiedler, MD 07/30/2023 1:58:04 PM This report has been signed electronically.

## 2023-07-30 NOTE — Progress Notes (Signed)
Pt's states no medical or surgical changes since previsit or office visit. 

## 2023-07-31 ENCOUNTER — Telehealth: Payer: Self-pay

## 2023-07-31 NOTE — Telephone Encounter (Signed)
No additional recs We should check on her again Monday JMP

## 2023-07-31 NOTE — Telephone Encounter (Signed)
  Follow up Call-     07/30/2023    1:25 PM  Call back number  Post procedure Call Back phone  # [phone]  Permission to leave phone message Yes     Patient questions:  Do you have a fever, pain , or abdominal swelling? No. Pain Score  0 *  Have you tolerated food without any problems? Yes.    Have you been able to return to your normal activities? Yes.    Do you have any questions about your discharge instructions: Diet   No. Medications  No. Follow up visit  No.  Do you have questions or concerns about your Care? Yes.    Patient states she woke up with hives this morning, she denies any SOB, wheezing or difficulty breathing.  She states she has a history of hives and that "she has had this happen several times".  She also states she "takes some type of herbal medication when she gets hives and she has not taken any medication as of yet, but is about to take her herbal medication".  RN informed patient that the anesthesia medication is out of the system very quickly and it is doubtful this is a reaction to the anesthesia.  She was instructed to seek emergency care if any respiratory issues developed and she verbalized understanding.  Will forward to Dr. Rhea Belton for any additional recommendations.  Actions: * If pain score is 4 or above: No action needed, pain <4.

## 2023-08-11 ENCOUNTER — Encounter: Payer: Self-pay | Admitting: Internal Medicine

## 2024-01-26 ENCOUNTER — Telehealth: Payer: Self-pay | Admitting: Internal Medicine

## 2024-01-26 NOTE — Telephone Encounter (Signed)
 Inbound call from patient stating that she was seen yesterday in the ER and was advised that she needed to call our office to get scheduled for a colonoscopy with Dr. Rhea Belton within in the next 3 days. Patient was seen for lower abdominal pain and lower back pain. Patient is requesting a call to discuss scheduling. Please advise.

## 2024-01-26 NOTE — Telephone Encounter (Signed)
 Spoke with pt and scheduled her to see amanda collier pa 01/27/24 at 11:30am. Pt aware of appt.

## 2024-01-27 ENCOUNTER — Ambulatory Visit: Payer: Medicare Other | Admitting: Physician Assistant

## 2024-01-27 ENCOUNTER — Encounter: Payer: Self-pay | Admitting: Physician Assistant

## 2024-01-27 VITALS — BP 108/70 | HR 78 | Ht 64.96 in | Wt 154.0 lb

## 2024-01-27 DIAGNOSIS — G8929 Other chronic pain: Secondary | ICD-10-CM | POA: Diagnosis not present

## 2024-01-27 DIAGNOSIS — R109 Unspecified abdominal pain: Secondary | ICD-10-CM

## 2024-01-27 DIAGNOSIS — K5900 Constipation, unspecified: Secondary | ICD-10-CM | POA: Diagnosis not present

## 2024-01-27 DIAGNOSIS — R935 Abnormal findings on diagnostic imaging of other abdominal regions, including retroperitoneum: Secondary | ICD-10-CM

## 2024-01-27 DIAGNOSIS — K6289 Other specified diseases of anus and rectum: Secondary | ICD-10-CM

## 2024-01-27 DIAGNOSIS — M6289 Other specified disorders of muscle: Secondary | ICD-10-CM

## 2024-01-27 DIAGNOSIS — N3941 Urge incontinence: Secondary | ICD-10-CM

## 2024-01-27 DIAGNOSIS — Z860100 Personal history of colon polyps, unspecified: Secondary | ICD-10-CM

## 2024-01-27 DIAGNOSIS — M545 Low back pain, unspecified: Secondary | ICD-10-CM

## 2024-01-27 DIAGNOSIS — K5904 Chronic idiopathic constipation: Secondary | ICD-10-CM

## 2024-01-27 DIAGNOSIS — K648 Other hemorrhoids: Secondary | ICD-10-CM | POA: Diagnosis not present

## 2024-01-27 MED ORDER — HYOSCYAMINE SULFATE 0.125 MG SL SUBL: 0.1250 mg | SUBLINGUAL_TABLET | Freq: Four times a day (QID) | SUBLINGUAL | 1 refills | Status: DC | PRN

## 2024-01-27 MED ORDER — PEG 3350-KCL-NA BICARB-NACL 420 G PO SOLR: ORAL | 0 refills | Status: DC

## 2024-01-27 NOTE — Progress Notes (Signed)
 01/27/2024 Jade Howard [id number] [birth year]  Referring provider: Rafael Howard, * Primary GI doctor: Dr. Rhea Howard  ASSESSMENT AND PLAN:     Abdominal Pain and Constipation Recent onset of abdominal pain and change in bowel habits. CT showed large stool burden and possible sigmoid mass. No signs of infection or anemia. Recent history of pelvic floor physical therapy for urinary incontinence. Possible worsening constipation from decreased movement/pelvic floor -Plan for flexible sigmoidoscopy with Dr. Sharla Howard to rule out mass in sigmoid colon most likely this is fecal impaction/stool burden with recent normal colonoscopy 07/2023 -Start bowel prep to facilitate bowel movements. -Try IBGard for abdominal discomfort. -Consider contacting pelvic floor physical therapy for potential adjustment of treatment plan.  Lower Back Pain Chronic lower back pain for approximately seven months. History of a fall a year ago. -Encourage increased physical activity as tolerated.  Urge Incontinence Ongoing issue for a couple of years. Recent completion of pelvic floor physical therapy. -Consider contacting pelvic floor physical therapy for potential adjustment of treatment plan.   Patient Care Team: Howard, Jade Lapping, MD as PCP - General (Family Medicine)  HISTORY OF PRESENT ILLNESS: 73 y.o. female with a past medical history of thoracic aneurysm, personal history of colon polyps and others listed below presents for evaluation of constipation and abnormal CT abdomen pelvis at [facility] ER 2/24.   01/25/2024 ER visit at [facility] for generalized abdominal pain and lower back pain LABS: Troponin negative, Hgb 13.4, no leukocytosis normal platelets normal Howard, liver. CT abdomen pelvis shows suspicion raised for sigmoid mass assessment limited by large stool burden shows colonic diverticulosis without diverticulitis bilateral hepatic hypodensities too sickly most likely benign  suspicious raised for sigmoid mass that measures 3.2 x 1.9 x 1.9 cm. Patient just had colonoscopy 07/30/2023 with Dr. Rhea Howard that showed 4 mm polyp descending colon 2 polyps 2 to 5 mm sigmoid colon and diverticulosis in the sigmoid colon descending transverse colon that was moderate.  1 polyp was tubular adenomatous polyps other 2 polyps were benign and not precancerous.  Recall 7 years.   Colonoscopy prior to this for screening purposes was 10 years and unremarkable.  Discussed the use of AI scribe software for clinical note transcription with the patient, who gave verbal consent to proceed.  History of Present Illness   Jade Howard "[name]" is a 73 year old female who presents with intense abdominal pain and constipation. She was referred from urgent care to the ER for further evaluation.  She has been experiencing intense abdominal pain for the past six days, severe enough to disrupt sleep. Initially, she sought care from her primary care provider but was unable to see her and was advised to visit urgent care. At urgent care, a urinary tract infection was ruled out, and she was referred to the emergency room for further evaluation. Imaging in the ER revealed a mass suspected to be impacted fecal material.  She reports a recent change in bowel habits, with constipation developing over the past few days. Previously, her bowel movements were regular, occurring twice daily, and were of normal consistency and color. In the last week, her stools have become smaller in diameter. She was not initially aware of the constipation until it was noted during her ER visit, where a large stool burden was identified. She has been using magnesium oxide to manage her bowel movements, which was previously effective but recently has not been working. Post-hospitalization, she has been using MiraLAX and Gatorade, which have  helped alleviate the constipation.  She experiences a sensation of rawness in her gut for several  months, which she does not classify as nausea, but it is uncomfortable. She has noticed mucus and bright red blood in her stool recently, which she attributes to hemorrhoids.  Over the past month, she has experienced shortness of breath and weakness, which she describes as unusual for her. No chest discomfort or cough. She also reports occasional swelling in her legs and has undergone lymphatic drainage therapy recently.  She has a history of back pain that has persisted for approximately seven months, primarily located in the lower back. This pain was initially evaluated by her primary care provider, who considered a urinary tract infection as a potential cause, but this was ruled out. The back pain preceded the onset of her abdominal pain.  She has a history of urge incontinence for the past couple of years, for which she has been undergoing pelvic floor physical therapy. She denies any recent weight loss, urinary issues, or significant changes in her diet, aside from increased nut consumption. She has not experienced any recent injuries that would limit her mobility, although she did have a significant fall a year ago, which initially reduced her mobility.      She  reports that she has never smoked. She has never used smokeless tobacco. She reports that she does not drink alcohol and does not use drugs.  RELEVANT GI HISTORY, LABS, IMAGING:  CT AB and pelvis with contrast [facility] for AB pain FINDINGS: THORAX: Non-vascular: Stable subcentimeter pulmonary nodules. No acute pulmonary infiltration. Patulous esophagus. Chronic thyroid heterogeneity and nodularity. Heart: Normal size. No pericardial effusion. Thoracic Aorta: Chronic aneurysmal dilatation of the ascending thoracic aorta measuring up to 3.9 cm diameter. No evidence of dissection.  No significant stenosis or occlusion of major arch vessels. Pulmonary vessels: Normal anatomy. No evidence of obstruction or central pulmonary  embolism.  ABDOMEN AND PELVIS: Nonvascular: Colonic diverticulosis without focal diverticulitis. Small bilateral hepatic hypodensities, statistically most likely benign. Nonspecific small gas locule near the duodenal ampulla. Large stool burden suggests constipation. Assessment is limited due to the phase of contrast enhancement, although there is suspicion raised for a sigmoid mass that measures 3.2 x 1.9 x 1.9 cm (11/159; 12/65). Abdominal aorta: No evidence of aneurysm dissection, significant stenosis, or occlusion. Celiac trunk and branches: No evidence of aneurysm, significant stenosis, or occlusion. Superior mesenteric trunk and branches: No evidence of aneurysm, significant stenosis, or occlusion. Inferior mesenteric trunk and branches: No evidence of aneurysm, significant stenosis, or occlusion. Renal arteries: No evidence of aneurysm, significant stenosis, or occlusion. There are additional 2 accessory right renal arteries. Iliac arteries: No evidence of aneurysm, significant stenosis, or occlusion. Femoral arteries: No evidence of aneurysm, significant stenosis, or occlusion. Venous circulation: IVC, portal vein and major veins patent.   07/30/2023 screening colonoscopy - One 4 mm polyp in the descending colon, removed with a cold snare. Resected and retrieved. - Two 2 to 5 mm polyps in the sigmoid colon, removed with a cold snare. Resected and retrieved. - Moderate diverticulosis in the sigmoid colon, in the descending colon and in the transverse colon. - The distal rectum and anal verge are normal on retroflexion view. Surgical [P], colon, descending polyp x1; sigmoid polyp x2, polyp (3) - TUBULAR ADENOMA (1 FRAGMENT) - HYPERPLASTIC POLYP(S) (2 FRAGMENTS) - NEGATIVE FOR HIGH-GRADE DYSPLASIA OR MALIGNANCY  Current Medications:       Current Outpatient Medications (Hematological):    Cyanocobalamin (B-12) 1000  MCG CAPS, Take 1 tablet by mouth 2 (two) times  daily.  Current Outpatient Medications (Other):    CALCIUM LACTATE PO, Take by mouth.   Fish Oil-Krill Oil (KRILL & FISH OIL BLEND PO),    hyoscyamine (LEVSIN SL) 0.125 MG SL tablet, Place 1 tablet (0.125 mg total) under the tongue every 6 (six) hours as needed for cramping (nausea, diarrhea).   MAGNESIUM GLYCINATE PLUS PO, Take 2 tablets by mouth at bedtime.   Multiple Vitamin (MULTIVITAMIN) capsule, Take 1 capsule by mouth daily.   NON FORMULARY, Take 1 tablet by mouth with breakfast, with lunch, and with evening meal. artestatin   NON FORMULARY, at bedtime. MenoBalance Cream 5 days a week   Nutritional Supplements (ADRENAL COMPLEX PO),    polyethylene glycol-electrolytes (NULYTELY) 420 g solution, do 6 oz every 30 mins until the impaction passes, can do 1/2 gallon on first day and 1/2 gallon on 2nd day.  Medical History:  Past Medical History:  Diagnosis Date   Allergy    Arthritis    Malachi Carl infection    Gastrointestinal parasites    Pt being treated for this now/05/2013   Lyme disease    Osteoporosis    Seizures (HCC)    Thyroid disease    Allergies:  Allergies  Allergen Reactions   Lamisil [Terbinafine] Other (See Comments)    Partial paralysis     Surgical History:  She  has a past surgical history that includes Colonoscopy and Knee Arthroplasty (Right, 2020). Family History:  Her family history includes Breast cancer in her maternal aunt and maternal grandmother; Clotting disorder in her father; Diabetes in her maternal uncle; Heart disease in her father and maternal grandfather.  REVIEW OF SYSTEMS  : All other systems reviewed and negative except where noted in the History of Present Illness.  PHYSICAL EXAM: BP 108/70 (BP Location: Left Arm, Patient Position: Sitting, Cuff Size: Normal)   Pulse 78   Ht 5' 4.96" (1.65 m)   Wt 154 lb (69.9 kg)   BMI 25.66 kg/m  GENERAL: Alert, cooperative, well developed, no acute distress. HEENT: Normocephalic, normal  oropharynx, moist mucous membranes. CHEST: Clear to auscultation bilaterally, no wheezes, rhonchi, or crackles. CARDIOVASCULAR: Normal heart rate and rhythm, S1 and S2 normal without murmurs. ABDOMEN: Soft, mild-tenderness diffuse, non-distended, without organomegaly, normal to hypoactive bowel sounds. RECTAL/Anoscopy: Decreased rectal tone, no rectal mass palpable, no significant stool in rectum, internal hemorrhoids present right posterior, excessive mucus in rectum. EXTREMITIES: No cyanosis or edema. NEUROLOGICAL: Cranial nerves grossly intact, moves all extremities without gross motor or sensory deficit.      Doree Albee, PA-C 12:19 PM

## 2024-01-27 NOTE — Patient Instructions (Addendum)
 Will send in [place]- take as directed- but do 6 oz every 30 mins until the impaction passes, can do 1/2 gallon on first day and 1/2 gallon on 2nd day.  Can continue 2 fleets enemas a day until it resolves.  Go to ER if any severe AB pain, vomiting/unable to hold down food/drink, blood in stool, or unable to pass gas.  Will schedule for flex sig  Go back to pelvic floor PT  First do a trial off milk/lactose products if you use them.  Increase activity Can do trial of IBGard which is over the counter for AB pain- Take 1-2 capsules once a day for maintence or twice a day during a flare For IBS and peppermint oil.  Peppermint oil has been proven to be better than placebo for cramping for IBS Stop if it worsens heart burn or causes flushing of your face.  Ideally enteric coated peppermint oil capsules 2 a day is best but if you got the oil, you can use 0.19ml or 180 mg of pepperment oil up to 3 x a day.   Can send in an anti spasm medication, Levsin, to take as needed    FODMAP stands for fermentable oligo-, di-, mono-saccharides and polyols (1). These are the scientific terms used to classify groups of carbs that are difficult for our body to digest and that are notorious for triggering digestive symptoms like bloating, gas, loose stools and stomach pain.   You can try low FODMAP diet  - start with eliminating just one column at a time that you feel may be a trigger for you. - the table at the very bottom contains foods that are low in FODMAPs   Sometimes trying to eliminate the FODMAP's from your diet is difficult or tricky, if you are stuggling with trying to do the elimination diet you can try an enzyme.  There is a food enzymes that you sprinkle in or on your food that helps break down the FODMAP. You can read more about the enzyme by going to this site: https://fodzyme.com/  _______________________________________________________  If your blood pressure at your visit was 140/90 or  greater, please contact your primary care physician to follow up on this.  _______________________________________________________  If you are age 74 or older, your body mass index should be between 23-30. Your Body mass index is 25.66 kg/m. If this is out of the aforementioned range listed, please consider follow up with your Primary Care Provider.  If you are age 84 or younger, your body mass index should be between 19-25. Your Body mass index is 25.66 kg/m. If this is out of the aformentioned range listed, please consider follow up with your Primary Care Provider.   ________________________________________________________  The [facility] providers would like to encourage you to use [facility] to communicate with providers for non-urgent requests or questions.  Due to long hold times on the telephone, sending your provider a message by [facility] may be a faster and more efficient way to get a response.  Please allow 48 business hours for a response.  Please remember that this is for non-urgent requests.  _______________________________________________________

## 2024-01-27 NOTE — Progress Notes (Signed)
 Addendum: Reviewed and agree with assessment and management plan. Asha Grumbine, Carie Caddy, MD

## 2024-02-03 NOTE — Telephone Encounter (Signed)
 Patient had previous colonoscopy 11 years ago without sedation, she had a colonoscopy 07/2023 with monitored sedation but wants to know if she can do the flex sig without sedation on the 18th.  Please advise.

## 2024-02-04 NOTE — Telephone Encounter (Signed)
 I am ok with unsedated flex sig Pt will have IV in case fluids, etc are needed We would have the ability to use moderate sedation (fentanyl, versed or either) in the event they were needed JMP

## 2024-02-04 NOTE — Telephone Encounter (Signed)
 Will set up for flexible sigmoidoscopy without sedation. Note sent to [facility].

## 2024-02-09 ENCOUNTER — Telehealth: Payer: Self-pay

## 2024-02-09 NOTE — Telephone Encounter (Signed)
 Per 02/09/24 patient message - patient requested referral to Pelvic floor PT with PT, Aron Baba at [facility].  [facility] - [facility] Phone: [phone]  Fax: [phone]

## 2024-03-08 ENCOUNTER — Ambulatory Visit (AMBULATORY_SURGERY_CENTER): Payer: Medicare Other | Admitting: Internal Medicine

## 2024-03-08 ENCOUNTER — Encounter: Payer: Self-pay | Admitting: Internal Medicine

## 2024-03-08 VITALS — BP 127/80 | HR 56 | Temp 97.6°F | Resp 16 | Ht 64.0 in | Wt 154.0 lb

## 2024-03-08 DIAGNOSIS — R933 Abnormal findings on diagnostic imaging of other parts of digestive tract: Secondary | ICD-10-CM

## 2024-03-08 DIAGNOSIS — K5730 Diverticulosis of large intestine without perforation or abscess without bleeding: Secondary | ICD-10-CM

## 2024-03-08 DIAGNOSIS — K6389 Other specified diseases of intestine: Secondary | ICD-10-CM

## 2024-03-08 MED ORDER — SODIUM CHLORIDE 0.9 % IV SOLN: 500.0000 mL | Freq: Once | INTRAVENOUS | Status: DC

## 2024-03-08 NOTE — Patient Instructions (Addendum)
 Resume previous diet Continue present medications Use Miralax daily and senna tea as needed for constipation  Handouts/information given for constipation and  diverticulosis   YOU HAD AN ENDOSCOPIC PROCEDURE TODAY AT THE [facility]:   Refer to the procedure report that was given to you for any specific questions about what was found during the examination.  If the procedure report does not answer your questions, please call your gastroenterologist to clarify.  If you requested that your care partner not be given the details of your procedure findings, then the procedure report has been included in a sealed envelope for you to review at your convenience later.  YOU SHOULD EXPECT: Some feelings of bloating in the abdomen. Passage of more gas than usual.  Walking can help get rid of the air that was put into your GI tract during the procedure and reduce the bloating. If you had a lower endoscopy (such as a colonoscopy or flexible sigmoidoscopy) you may notice spotting of blood in your stool or on the toilet paper. If you underwent a bowel prep for your procedure, you may not have a normal bowel movement for a few days.  SYMPTOMS TO REPORT IMMEDIATELY:  Following lower endoscopy (flexible sigmoidoscopy):  Excessive amounts of blood in the stool  Significant tenderness or worsening of abdominal pains  Swelling of the abdomen that is new, acute  Fever of 100F or higher For urgent or emergent issues, a gastroenterologist can be reached at any hour by calling [phone]. Do not use MyChart messaging for urgent concerns.   DIET:  We do recommend a small meal at first, but then you may proceed to your regular diet.  Drink plenty of fluids but you should avoid alcoholic beverages for 24 hours.  ACTIVITY: back to normal activity, no sedation today!  FOLLOW UP: Our staff will call the number listed on your records the next business day following your procedure.  We will call around  7:15- 8:00 am to check on you and address any questions or concerns that you may have regarding the information given to you following your procedure. If we do not reach you, we will leave a message.     SIGNATURES/CONFIDENTIALITY: You and/or your care partner have signed paperwork which will be entered into your electronic medical record.  These signatures attest to the fact that that the information above on your After Visit Summary has been reviewed and is understood.  Full responsibility of the confidentiality of this discharge information lies with you and/or your care-partner.

## 2024-03-08 NOTE — Progress Notes (Signed)
 GASTROENTEROLOGY PROCEDURE H&P NOTE   Primary Care Physician: Punger, Demetrio Lapping, MD    Reason for Procedure:   Abnormal ct sigmoid  Plan:    Flex sig  Patient is appropriate for endoscopic procedure(s) in the ambulatory ([facility]) setting.  The nature of the procedure, as well as the risks, benefits, and alternatives were carefully and thoroughly reviewed with the patient. Ample time for discussion and questions allowed. The patient understood, was satisfied, and agreed to proceed.     HPI: Jade Howard is a 73 y.o. female who presents for flexible sigmoidoscopy.  Medical history as below.  Tolerated the prep.  No recent chest pain or shortness of breath.  No abdominal pain today.  Past Medical History:  Diagnosis Date   Allergy    Arthritis    Diverticulosis    Epstein Barr infection    Gastrointestinal parasites    Pt being treated for this now/05/2013   Lyme disease    Osteoporosis    Seizures (HCC)    Thyroid disease     Past Surgical History:  Procedure Laterality Date   COLONOSCOPY     KNEE ARTHROPLASTY Right 2020    Prior to Admission medications   Medication Sig Start Date End Date Taking? Authorizing Provider  CALCIUM LACTATE PO Take by mouth.    [provider]  Cyanocobalamin (B-12) 1000 MCG CAPS Take 1 tablet by mouth 2 (two) times daily.    [provider]  Fish Oil-Krill Oil (KRILL & FISH OIL BLEND PO)  01/30/23   [provider]  hyoscyamine (LEVSIN SL) 0.125 MG SL tablet Place 1 tablet (0.125 mg total) under the tongue every 6 (six) hours as needed for cramping (nausea, diarrhea). 01/27/24   Doree Albee, PA-C  MAGNESIUM GLYCINATE PLUS PO Take 2 tablets by mouth at bedtime.    [provider]  Multiple Vitamin (MULTIVITAMIN) capsule Take 1 capsule by mouth daily. 05/28/22   [provider]  NON FORMULARY Take 1 tablet by mouth with breakfast, with lunch, and with evening meal. artestatin    [provider]  NON FORMULARY at bedtime. MenoBalance Cream 5 days a week    [provider]  Nutritional Supplements (ADRENAL COMPLEX PO)  12/01/20   [provider]  polyethylene glycol-electrolytes (NULYTELY) 420 g solution do 6 oz every 30 mins until the impaction passes, can do 1/2 gallon on first day and 1/2 gallon on 2nd day. 01/27/24   Doree Albee, PA-C    Current Outpatient Medications  Medication Sig Dispense Refill   CALCIUM LACTATE PO Take by mouth.     Cyanocobalamin (B-12) 1000 MCG CAPS Take 1 tablet by mouth 2 (two) times daily.     Fish Oil-Krill Oil (KRILL & FISH OIL BLEND PO)      hyoscyamine (LEVSIN SL) 0.125 MG SL tablet Place 1 tablet (0.125 mg total) under the tongue every 6 (six) hours as needed for cramping (nausea, diarrhea). 20 tablet 1   MAGNESIUM GLYCINATE PLUS PO Take 2 tablets by mouth at bedtime.     Multiple Vitamin (MULTIVITAMIN) capsule Take 1 capsule by mouth daily.     NON FORMULARY Take 1 tablet by mouth with breakfast, with lunch, and with evening meal. artestatin     NON FORMULARY at bedtime. MenoBalance Cream 5 days a week     Nutritional Supplements (ADRENAL COMPLEX PO)      polyethylene glycol-electrolytes (NULYTELY) 420 g solution do 6 oz every 30 mins  until the impaction passes, can do 1/2 gallon on first day and 1/2 gallon on 2nd day. 4000 mL 0   Current Facility-Administered Medications  Medication Dose Route Frequency Provider Last Rate Last Admin   0.9 %  sodium chloride infusion  500 mL Intravenous Once Teren Franckowiak, Carie Caddy, MD        Allergies as of 03/08/2024 - Review Complete 03/08/2024  Allergen Reaction Noted   Lamisil [terbinafine] Other (See Comments) 12/20/2012    Family History  Problem Relation Age of Onset   Clotting disorder Father    Heart disease Father    Breast cancer Maternal Aunt    Diabetes Maternal Uncle    Breast cancer Maternal Grandmother    Heart disease Maternal Grandfather    Colon cancer Neg  Hx    Esophageal cancer Neg Hx    Rectal cancer Neg Hx    Stomach cancer Neg Hx    Colon polyps Neg Hx     Social History   Socioeconomic History   Marital status: Unknown    Spouse name: Not on file   Number of children: Not on file   Years of education: Not on file   Highest education level: Not on file  Occupational History   Not on file  Tobacco Use   Smoking status: Never   Smokeless tobacco: Never  Vaping Use   Vaping status: Never Used  Substance and Sexual Activity   Alcohol use: No   Drug use: No   Sexual activity: Not on file  Other Topics Concern   Not on file  Social History Narrative   Not on file   Social Drivers of Health   Financial Resource Strain: Low Risk  (08/02/2022)   Received from [facility]   Overall Financial Resource Strain (CARDIA)    Difficulty of Paying Living Expenses: Not very hard  Food Insecurity: Low Risk  (06/01/2023)   [facility]   Hunger Vital Sign    Worried About Running Out of Food in the Last Year: Never true    Ran Out of Food in the Last Year: Never true  Transportation Needs: No Transportation Needs (06/01/2023)   [facility]   Transportation    In the past 12 months, has lack of reliable transportation kept you from medical appointments, meetings, work or from getting things needed for daily living? : No  Physical Activity: Sufficiently Active (08/02/2022)   Received from [facility], [facility]   Exercise Vital Sign    Days of Exercise per Week: 6 days    Minutes of Exercise per Session: 40 min  Stress: No Stress Concern Present (08/02/2022)   Received from [facility], [facility]   [employer] of Occupational Health - Occupational Stress Questionnaire    Feeling of Stress : Only a little  Social Connections: Unknown (08/05/2023)   Received from [facility]   Social Network    Social Network: Not on file  Intimate Partner  Violence: Unknown (08/05/2023)   Received from [facility]   HITS    Physically Hurt: Not on file    Insult or Talk Down To: Not on file    Threaten Physical Harm: Not on file    Scream or Curse: Not on file    Physical Exam: Vital signs in last 24 hours: @BP  (!) 143/86   Pulse 62   Temp 97.6 F (36.4 C) (Temporal)   Ht 5\' 4"  (  1.626 m)   Wt 154 lb (69.9 kg)   SpO2 97%   BMI 26.43 kg/m  GEN: NAD EYE: Sclerae anicteric ENT: MMM CV: Non-tachycardic Pulm: CTA b/l GI: Soft, NT/ND NEURO:  Alert & Oriented x 3   Erick Blinks, MD [facility]  03/08/2024 2:29 PM

## 2024-03-08 NOTE — Progress Notes (Signed)
 Pt requested no sedation or oxygen. tb

## 2024-03-08 NOTE — Op Note (Signed)
 [facility] Patient Name: Jade Howard Procedure Date: 03/08/2024 2:28 PM MRN: [id number] Endoscopist: Beverley Fiedler , MD, [id number] Age: 73 Referring MD:  Date of Birth: 1951-01-31 Gender: Female Account #: [id number] Procedure:                Flexible Sigmoidoscopy Indications:              Abnormal CT of the GI tract Medicines:                None Procedure:                Pre-Anesthesia Assessment:                           - Prior to the procedure, a History and Physical                            was performed, and patient medications and                            allergies were reviewed. The patient's tolerance of                            previous anesthesia was also reviewed. The risks                            and benefits of the procedure and the sedation                            options and risks were discussed with the patient.                            All questions were answered, and informed consent                            was obtained. Prior Anticoagulants: The patient has                            taken no anticoagulant or antiplatelet agents. ASA                            Grade Assessment: II - A patient with mild systemic                            disease. After reviewing the risks and benefits,                            the patient was deemed in satisfactory condition to                            undergo the procedure.                           After obtaining informed consent, the scope was  passed under direct vision. The [id number]-[id number]                            Colonoscope [id number] was introduced through the anus                            and advanced to the descending colon. The flexible                            sigmoidoscopy was accomplished without difficulty.                            The patient tolerated the procedure well. The                            quality of the bowel preparation was good. Scope In:  2:49:32 PM Scope Out: 3:02:49 PM Total Procedure Duration: 0 hours 13 minutes 17 seconds  Findings:                 The digital rectal exam was normal.                           Multiple medium-mouthed and small-mouthed                            diverticula were found in the sigmoid colon.                           A diffuse area of mild melanosis was found in the                            entire colon.                           The exam was otherwise normal throughout the                            examined colon.                           The retroflexed view of the distal rectum and anal                            verge was normal and showed no anal or rectal                            abnormalities. Complications:            No immediate complications. Estimated Blood Loss:     Estimated blood loss: none. Impression:               - Moderate diverticulosis in the sigmoid colon.                           - Melanosis in the colon.                           -  Sigmoid "mass" suggested by CT scan is felt to                            have been hard stool in the setting of                            constipation. No polyps or masses found.                           - No specimens collected. Recommendation:           - Patient has a contact number available for                            emergencies. The signs and symptoms of potential                            delayed complications were discussed with the                            patient. Return to normal activities tomorrow.                            Written discharge instructions were provided to the                            patient.                           - Continue present medications.                           - Begin trial of MiraLax 17 g daily. Can be dose                            titrated for complete BMs. Okay for senna-tea as                            needed. Beverley Fiedler, MD 03/08/2024 3:10:46 PM This report has  been signed electronically.

## 2024-03-09 ENCOUNTER — Telehealth: Payer: Self-pay

## 2024-03-09 NOTE — Telephone Encounter (Signed)
  Follow up Call-     03/08/2024    2:07 PM 07/30/2023    1:25 PM  Call back number  Post procedure Call Back phone  # [phone]  Permission to leave phone message Yes Yes     Patient questions:  Do you have a fever, pain , or abdominal swelling? No. Pain Score  0 *  Have you tolerated food without any problems? Yes.    Have you been able to return to your normal activities? Yes.    Do you have any questions about your discharge instructions: Diet   No. Medications  No. Follow up visit  No.  Do you have questions or concerns about your Care? No.  Actions: * If pain score is 4 or above: No action needed, pain <4.
